# Patient Record
Sex: Female | Born: 1980 | Race: White | Hispanic: No | Marital: Single | Smoking: Current every day smoker
Health system: Southern US, Community
[De-identification: ages and names within clinical notes are randomized; demographics above are authoritative.]

## PROBLEM LIST (undated history)

## (undated) DIAGNOSIS — F909 Attention-deficit hyperactivity disorder, unspecified type: Secondary | ICD-10-CM

## (undated) DIAGNOSIS — E079 Disorder of thyroid, unspecified: Secondary | ICD-10-CM

---

## 2001-08-25 ENCOUNTER — Other Ambulatory Visit: Admission: RE | Admit: 2001-08-25 | Discharge: 2001-08-25 | Payer: Self-pay | Admitting: *Deleted

## 2002-01-12 ENCOUNTER — Ambulatory Visit (HOSPITAL_COMMUNITY): Admission: RE | Admit: 2002-01-12 | Discharge: 2002-01-12 | Payer: Self-pay | Admitting: Gastroenterology

## 2002-09-12 ENCOUNTER — Other Ambulatory Visit: Admission: RE | Admit: 2002-09-12 | Discharge: 2002-09-12 | Payer: Self-pay | Admitting: *Deleted

## 2003-11-15 ENCOUNTER — Other Ambulatory Visit: Admission: RE | Admit: 2003-11-15 | Discharge: 2003-11-15 | Payer: Self-pay | Admitting: Family Medicine

## 2008-08-03 ENCOUNTER — Encounter: Admission: RE | Admit: 2008-08-03 | Discharge: 2008-08-03 | Payer: Self-pay | Admitting: Internal Medicine

## 2010-07-19 NOTE — Op Note (Signed)
   NAMEBREKYN, Kayla Robinson                       ACCOUNT NO.:  [id number]   MEDICAL RECORD NO.:  [id number]                   PATIENT TYPE:   LOCATION:                                       FACILITY:   PHYSICIAN:  Anselmo Rod, M.D.               DATE OF BIRTH:  27-Mar-1980   DATE OF PROCEDURE:  01/12/2002  DATE OF DISCHARGE:                                 OPERATIVE REPORT   PROCEDURE PERFORMED:  Colonoscopy.   ENDOSCOPIST:  Charna Elizabeth, M.D.   INSTRUMENT USED:  Pediatric adjustable Olympus colonoscope.   INDICATIONS FOR PROCEDURE:  The patient is a 30 year old white female with a  history of guaiac positive stools.  Family history of colon cancer in a  maternal aunt and ovarian cancer in her mother.  Rule out colonic polyps,  masses,  etc.  versus IBD.   PREPROCEDURE PREPARATION:  Informed consent was procured from the patient.  The patient was fasted for eight hours prior to the procedure and prepped  with a bottle of magnesium citrate and a gallon of NuLytely the night prior  to the procedure.   PREPROCEDURE PHYSICAL:  The patient had stable vital signs.  Neck supple.  Chest clear to auscultation.  S1 and S2 regular.  Abdomen soft with normal  bowel sounds.   DESCRIPTION OF PROCEDURE:  The patient was placed in left lateral decubitus  position and sedated with 100 mg of Demerol and 14 mg of Versed  intravenously.  Once the patient was adequately sedated and maintained on  low flow oxygen and continuous cardiac monitoring, the Olympus video  colonoscope was advanced from the rectum to the cecum and terminal ileum  without difficulty.  The entire colonic mucosa appeared healthy with a  normal vascular pattern.  No masses, polyps, erosions, ulcerations or  diverticula were seen.   IMPRESSION:  Normal colonoscopy to the terminal ileum.   RECOMMENDATIONS:  1. Repeat guaiac testing on an outpatient basis.  Further recommendations     made thereafter.  2.     Considering  the patient's family history of colon cancer, repeat colorectal      cancer screening is recommended at the age of 16 unless the patient     develops any abnormal symptoms in the interim.  3. High fiber diet with liberal fluid intake.                                                   Anselmo Rod, M.D.    JNM/MEDQ  D:  01/12/2002  T:  01/12/2002  Job:  [id number]   cc:   Talmadge Coventry, M.D.

## 2010-12-30 ENCOUNTER — Ambulatory Visit: Payer: Self-pay

## 2010-12-30 ENCOUNTER — Other Ambulatory Visit: Payer: Self-pay | Admitting: Occupational Medicine

## 2010-12-30 DIAGNOSIS — M542 Cervicalgia: Secondary | ICD-10-CM

## 2011-01-21 ENCOUNTER — Ambulatory Visit: Payer: Self-pay

## 2011-01-31 ENCOUNTER — Ambulatory Visit
Payer: Worker's Compensation | Attending: Occupational Medicine | Admitting: Rehabilitative and Restorative Service Providers"

## 2011-01-31 DIAGNOSIS — M545 Low back pain, unspecified: Secondary | ICD-10-CM | POA: Insufficient documentation

## 2011-01-31 DIAGNOSIS — M546 Pain in thoracic spine: Secondary | ICD-10-CM | POA: Insufficient documentation

## 2011-01-31 DIAGNOSIS — M542 Cervicalgia: Secondary | ICD-10-CM | POA: Insufficient documentation

## 2011-01-31 DIAGNOSIS — IMO0001 Reserved for inherently not codable concepts without codable children: Secondary | ICD-10-CM | POA: Insufficient documentation

## 2011-01-31 DIAGNOSIS — M2560 Stiffness of unspecified joint, not elsewhere classified: Secondary | ICD-10-CM | POA: Insufficient documentation

## 2011-02-04 ENCOUNTER — Ambulatory Visit
Payer: Worker's Compensation | Attending: Occupational Medicine | Admitting: Rehabilitative and Restorative Service Providers"

## 2011-02-04 DIAGNOSIS — M542 Cervicalgia: Secondary | ICD-10-CM | POA: Insufficient documentation

## 2011-02-04 DIAGNOSIS — IMO0001 Reserved for inherently not codable concepts without codable children: Secondary | ICD-10-CM | POA: Insufficient documentation

## 2011-02-04 DIAGNOSIS — M2560 Stiffness of unspecified joint, not elsewhere classified: Secondary | ICD-10-CM | POA: Insufficient documentation

## 2011-02-04 DIAGNOSIS — M545 Low back pain, unspecified: Secondary | ICD-10-CM | POA: Insufficient documentation

## 2011-02-04 DIAGNOSIS — M546 Pain in thoracic spine: Secondary | ICD-10-CM | POA: Insufficient documentation

## 2011-02-05 ENCOUNTER — Ambulatory Visit: Payer: Worker's Compensation | Admitting: Rehabilitative and Restorative Service Providers"

## 2011-02-12 ENCOUNTER — Ambulatory Visit: Payer: Worker's Compensation | Admitting: Rehabilitative and Restorative Service Providers"

## 2011-02-13 ENCOUNTER — Ambulatory Visit: Payer: Worker's Compensation | Admitting: Rehabilitative and Restorative Service Providers"

## 2011-02-18 ENCOUNTER — Ambulatory Visit: Payer: Worker's Compensation | Admitting: Rehabilitative and Restorative Service Providers"

## 2011-02-19 ENCOUNTER — Ambulatory Visit: Payer: Worker's Compensation | Admitting: Rehabilitative and Restorative Service Providers"

## 2013-03-26 IMAGING — CR DG CERVICAL SPINE COMPLETE 4+V
5 series · 5 of 5 positions shown · non-contrast
Comparison: Plain film cervical spine 08/03/2008.

CLINICAL DATA: Neck pain.

CERVICAL SPINE - COMPLETE 4+ VIEW

[view not recorded (1 of 5)]
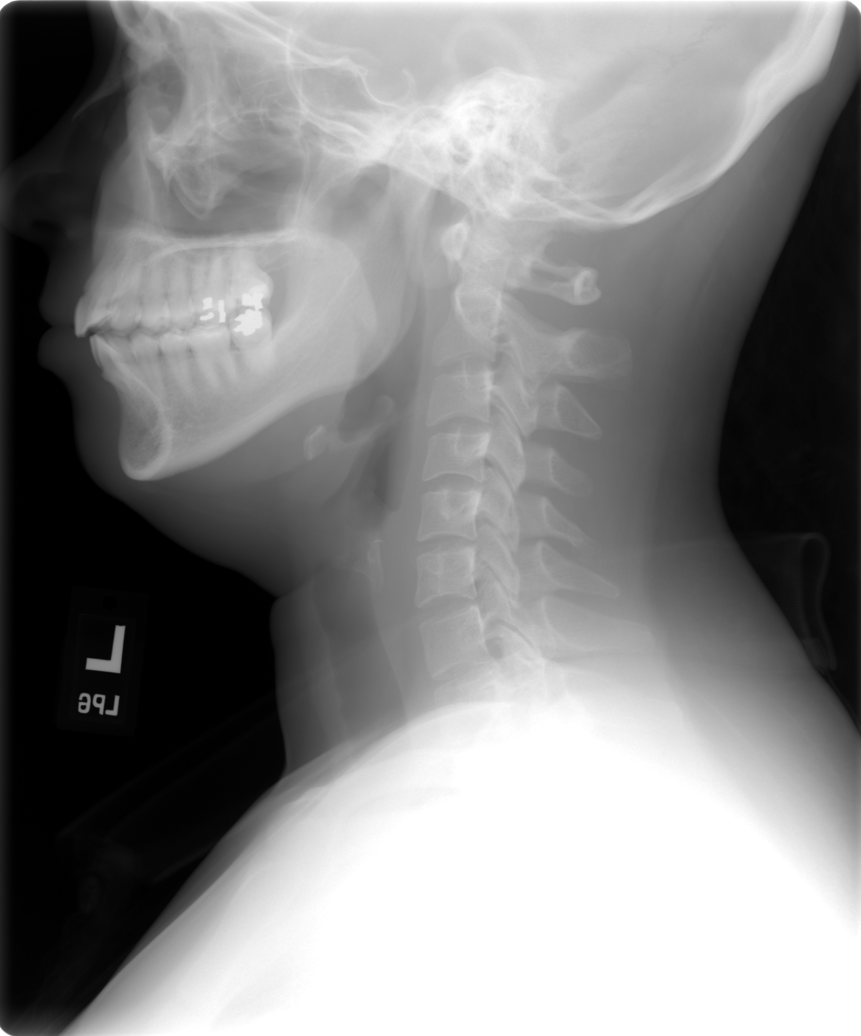

[view not recorded (2 of 5)]
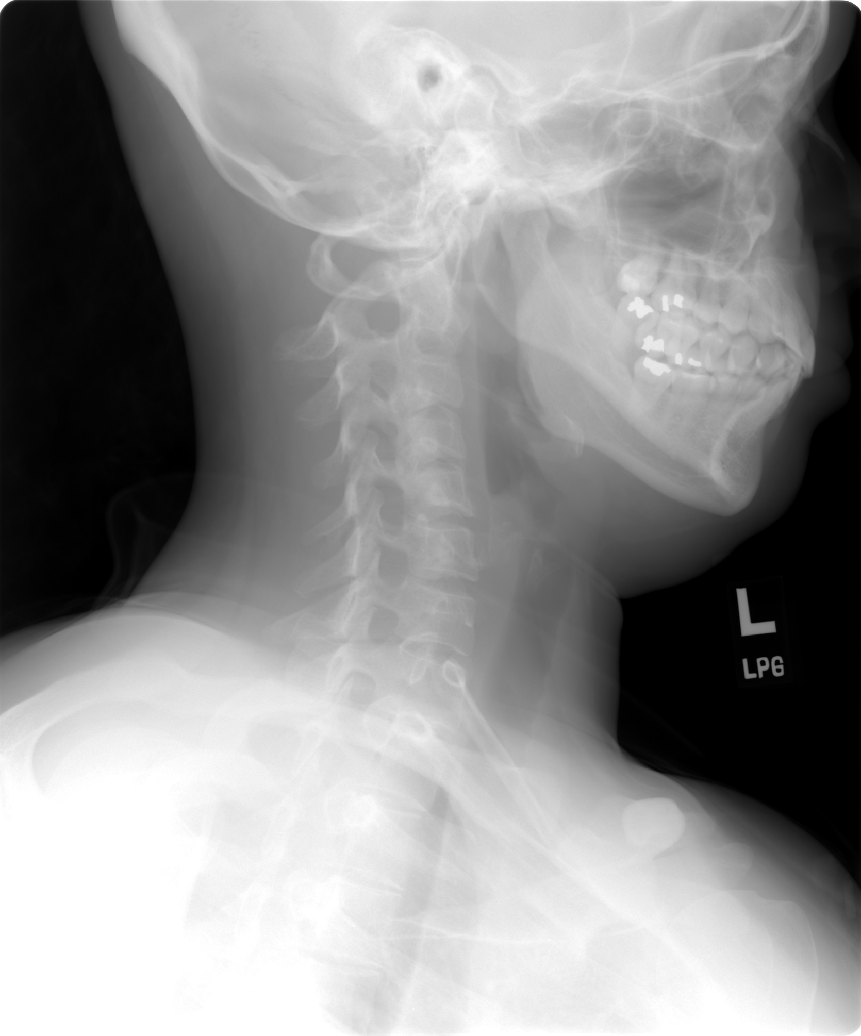

[view not recorded (3 of 5)]
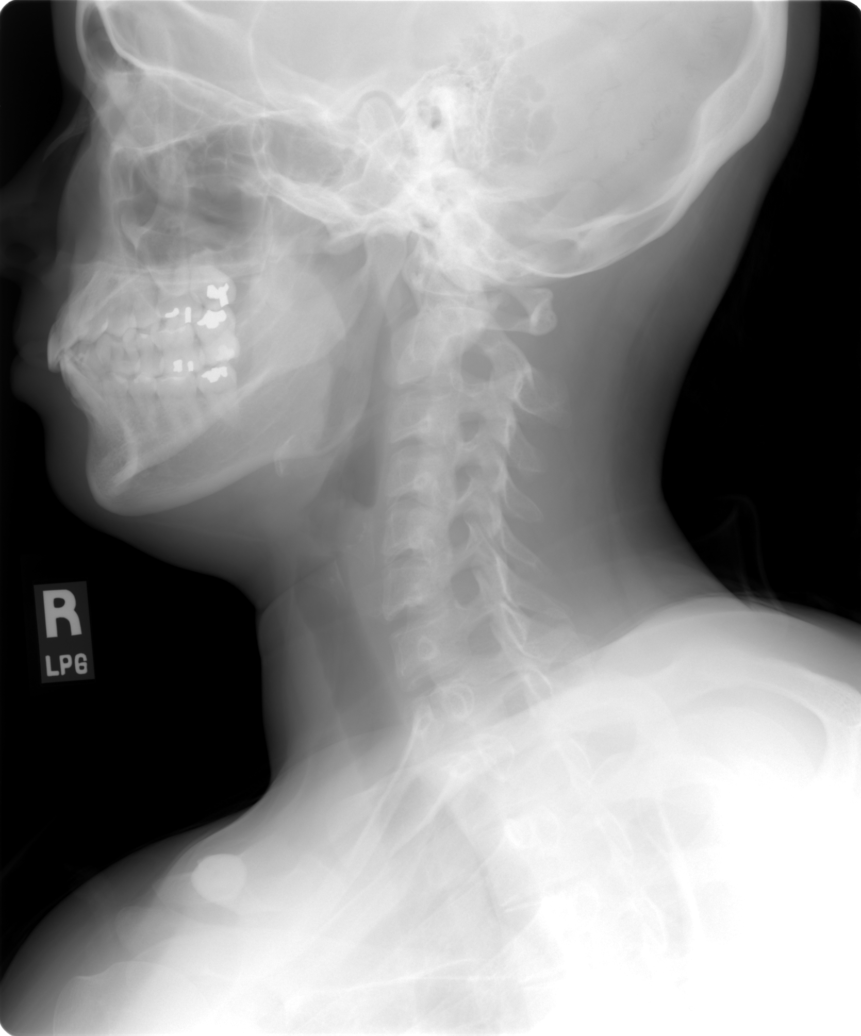

[view not recorded (4 of 5)]
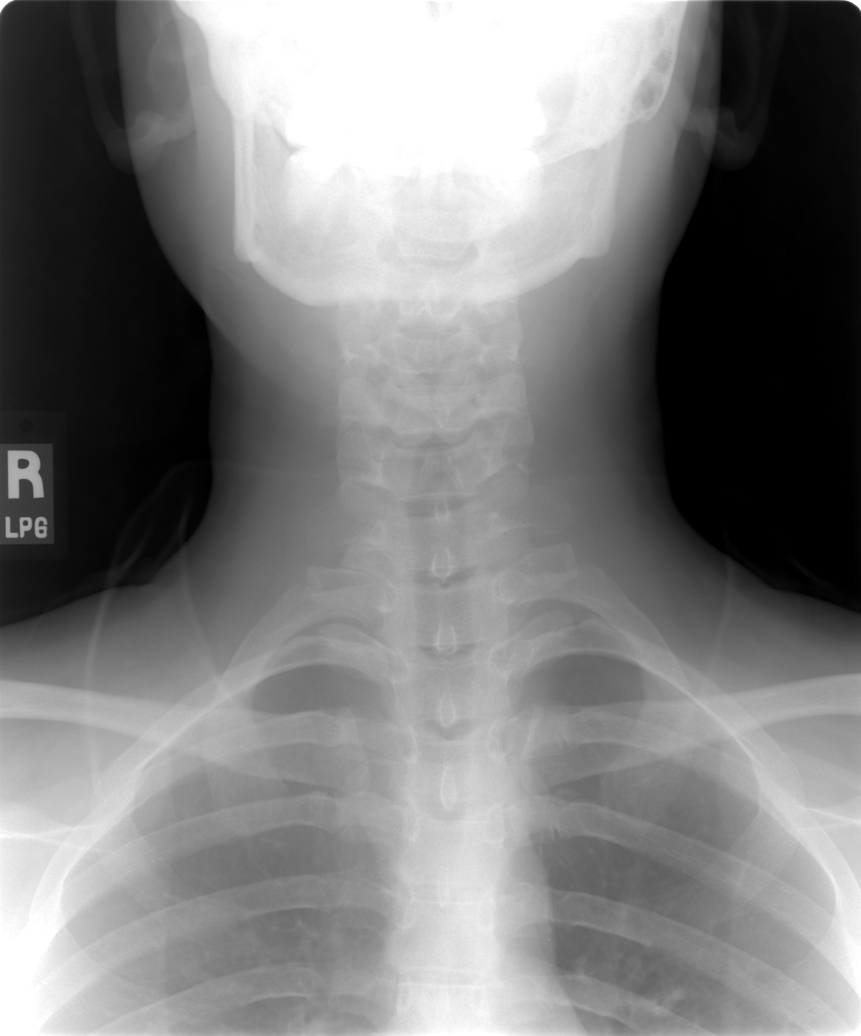

[view not recorded (5 of 5)]
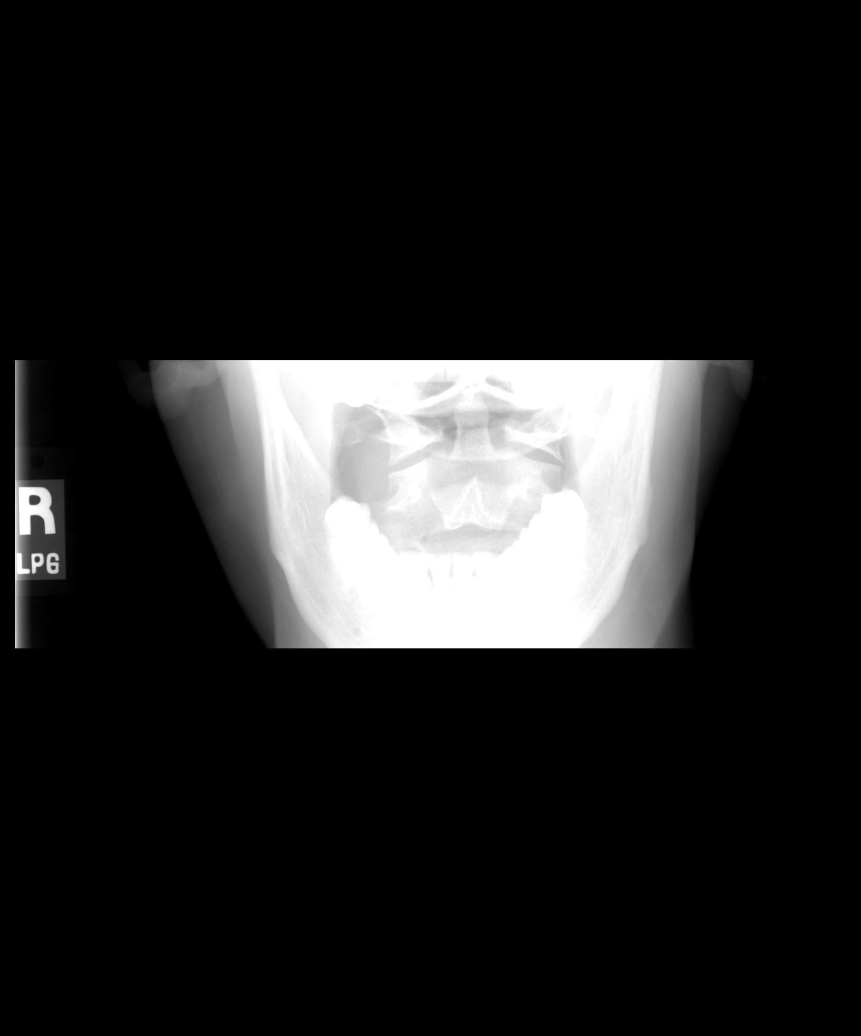

[5 of 5 positions shown; findings below may reference images not displayed]

FINDINGS: Vertebral body height and alignment are normal.
Intervertebral disc space height is maintained.  Neural foramina
widely patent at all levels.  Lung apices are clear.  Prevertebral
soft tissues appear normal.
IMPRESSION: Negative exam.

## 2015-05-09 ENCOUNTER — Emergency Department (HOSPITAL_COMMUNITY)
Admission: EM | Admit: 2015-05-09 | Discharge: 2015-05-09 | Disposition: A | Discharge status: Home / Self Care | Payer: Worker's Compensation | Attending: Emergency Medicine | Admitting: Emergency Medicine

## 2015-05-09 ENCOUNTER — Encounter (HOSPITAL_COMMUNITY): Payer: Self-pay

## 2015-05-09 DIAGNOSIS — Y990 Civilian activity done for income or pay: Secondary | ICD-10-CM | POA: Insufficient documentation

## 2015-05-09 DIAGNOSIS — S61011A Laceration without foreign body of right thumb without damage to nail, initial encounter: Secondary | ICD-10-CM

## 2015-05-09 DIAGNOSIS — W268XXA Contact with other sharp object(s), not elsewhere classified, initial encounter: Secondary | ICD-10-CM | POA: Insufficient documentation

## 2015-05-09 DIAGNOSIS — Y9389 Activity, other specified: Secondary | ICD-10-CM | POA: Insufficient documentation

## 2015-05-09 DIAGNOSIS — Y9289 Other specified places as the place of occurrence of the external cause: Secondary | ICD-10-CM | POA: Insufficient documentation

## 2015-05-09 DIAGNOSIS — F17200 Nicotine dependence, unspecified, uncomplicated: Secondary | ICD-10-CM | POA: Insufficient documentation

## 2015-05-09 DIAGNOSIS — Z23 Encounter for immunization: Secondary | ICD-10-CM | POA: Insufficient documentation

## 2015-05-09 DIAGNOSIS — Z8639 Personal history of other endocrine, nutritional and metabolic disease: Secondary | ICD-10-CM | POA: Insufficient documentation

## 2015-05-09 DIAGNOSIS — Z8659 Personal history of other mental and behavioral disorders: Secondary | ICD-10-CM | POA: Insufficient documentation

## 2015-05-09 HISTORY — DX: Attention-deficit hyperactivity disorder, unspecified type: F90.9

## 2015-05-09 HISTORY — DX: Disorder of thyroid, unspecified: E07.9

## 2015-05-09 MED ORDER — TETANUS-DIPHTH-ACELL PERTUSSIS 5-2.5-18.5 LF-MCG/0.5 IM SUSP
0.5000 mL | Freq: Once | INTRAMUSCULAR | Status: AC
  Administered 2015-05-09: 0.5 mL via INTRAMUSCULAR
  Filled 2015-05-09: qty 0.5

## 2015-05-09 NOTE — Discharge Instructions (Signed)
The laceration was superficial, and so we didn't need stitches.  Please read the instructions provided on wound care. Keep the area clean and dry, apply bacitracin ointment daily and take the medications provided. RETURN TO THE ER IF THERE IS INCREASED PAIN, REDNESS, PUS COMING OUT from the wound site.   Stitches, Staples, or Adhesive Wound Closure Health care providers use stitches (sutures), staples, and certain glue (skin adhesives) to hold skin together while it heals (wound closure). You may need this treatment after you have surgery or if you cut your skin accidentally. These methods help your skin to heal more quickly and make it less likely that you will have a scar. A wound may take several months to heal completely. The type of wound you have determines when your wound gets closed. In most cases, the wound is closed as soon as possible (primary skin closure). Sometimes, closure is delayed so the wound can be cleaned and allowed to heal naturally. This reduces the chance of infection. Delayed closure may be needed if your wound:  Is caused by a bite.  Happened more than 6 hours ago.  Involves loss of skin or the tissues under the skin.  Has dirt or debris in it that cannot be removed.  Is infected. WHAT ARE THE DIFFERENT KINDS OF WOUND CLOSURES? There are many options for wound closure. The one that your health care provider uses depends on how deep and how large your wound is. Adhesive Glue To use this type of glue to close a wound, your health care provider holds the edges of the wound together and paints the glue on the surface of your skin. You may need more than one layer of glue. Then the wound may be covered with a light bandage (dressing). This type of skin closure may be used for small wounds that are not deep (superficial). Using glue for wound closure is less painful than other methods. It does not require a medicine that numbs the area (local anesthetic). This method also  leaves nothing to be removed. Adhesive glue is often used for children and on facial wounds. Adhesive glue cannot be used for wounds that are deep, uneven, or bleeding. It is not used inside of a wound.  Adhesive Strips These strips are made of sticky (adhesive), porous paper. They are applied across your skin edges like a regular adhesive bandage. You leave them on until they fall off. Adhesive strips may be used to close very superficial wounds. They may also be used along with sutures to improve the closure of your skin edges.  Sutures Sutures are the oldest method of wound closure. Sutures can be made from natural substances, such as silk, or from synthetic materials, such as nylon and steel. They can be made from a material that your body can break down as your wound heals (absorbable), or they can be made from a material that needs to be removed from your skin (nonabsorbable). They come in many different strengths and sizes. Your health care provider attaches the sutures to a steel needle on one end. Sutures can be passed through your skin, or through the tissues beneath your skin. Then they are tied and cut. Your skin edges may be closed in one continuous stitch or in separate stitches. Sutures are strong and can be used for all kinds of wounds. Absorbable sutures may be used to close tissues under the skin. The disadvantage of sutures is that they may cause skin reactions that lead to infection.  Nonabsorbable sutures need to be removed. Staples When surgical staples are used to close a wound, the edges of your skin on both sides of the wound are brought close together. A staple is placed across the wound, and an instrument secures the edges together. Staples are often used to close surgical cuts (incisions). Staples are faster to use than sutures, and they cause less skin reaction. Staples need to be removed using a tool that bends the staples away from your skin. HOW DO I CARE FOR MY WOUND  CLOSURE?  Take medicines only as directed by your health care provider.  If you were prescribed an antibiotic medicine for your wound, finish it all even if you start to feel better.  Use ointments or creams only as directed by your health care provider.  Wash your hands with soap and water before and after touching your wound.  Do not soak your wound in water. Do not take baths, swim, or use a hot tub until your health care provider approves.  Ask your health care provider when you can start showering. Cover your wound if directed by your health care provider.  Do not take out your own sutures or staples.  Do not pick at your wound. Picking can cause an infection.  Keep all follow-up visits as directed by your health care provider. This is important. HOW LONG WILL I HAVE MY WOUND CLOSURE?  Leave adhesive glue on your skin until the glue peels away.  Leave adhesive strips on your skin until the strips fall off.  Absorbable sutures will dissolve within several days.  Nonabsorbable sutures and staples must be removed. The location of the wound will determine how long they stay in. This can range from several days to a couple of weeks. WHEN SHOULD I SEEK HELP FOR MY WOUND CLOSURE? Contact your health care provider if:  You have a fever.  You have chills.  You have drainage, redness, swelling, or pain at your wound.  There is a bad smell coming from your wound.  The skin edges of your wound start to separate after your sutures have been removed.  Your wound becomes thick, raised, and darker in color after your sutures come out (scarring).   This information is not intended to replace advice given to you by your health care provider. Make sure you discuss any questions you have with your health care provider.   Document Released: 11/12/2000 Document Revised: 03/10/2014 Document Reviewed: 07/27/2013 Elsevier Interactive Patient Education 2016 [employer]  Wound  Care Taking care of your wound properly can help to prevent pain and infection. It can also help your wound to heal more quickly.  HOW TO CARE FOR YOUR WOUND  Take or apply over-the-counter and prescription medicines only as told by your health care provider.  If you were prescribed antibiotic medicine, take or apply it as told by your health care provider. Do not stop using the antibiotic even if your condition improves.  Clean the wound each day or as told by your health care provider.  Wash the wound with mild soap and water.  Rinse the wound with water to remove all soap.  Pat the wound dry with a clean towel. Do not rub it.  There are many different ways to close and cover a wound. For example, a wound can be covered with stitches (sutures), skin glue, or adhesive strips. Follow instructions from your health care provider about:  How to take care of your wound.  When  and how you should change your bandage (dressing).  When you should remove your dressing.  Removing whatever was used to close your wound.  Check your wound every day for signs of infection. Watch for:  Redness, swelling, or pain.  Fluid, blood, or pus.  Keep the dressing dry until your health care provider says it can be removed. Do not take baths, swim, use a hot tub, or do anything that would put your wound underwater until your health care provider approves.  Raise (elevate) the injured area above the level of your heart while you are sitting or lying down.  Do not scratch or pick at the wound.  Keep all follow-up visits as told by your health care provider. This is important. SEEK MEDICAL CARE IF:  You received a tetanus shot and you have swelling, severe pain, redness, or bleeding at the injection site.  You have a fever.  Your pain is not controlled with medicine.  You have increased redness, swelling, or pain at the site of your wound.  You have fluid, blood, or pus coming from your  wound.  You notice a bad smell coming from your wound or your dressing. SEEK IMMEDIATE MEDICAL CARE IF:  You have a red streak going away from your wound.   This information is not intended to replace advice given to you by your health care provider. Make sure you discuss any questions you have with your health care provider.   Document Released: 11/27/2007 Document Revised: 07/04/2014 Document Reviewed: 02/13/2014 Elsevier Interactive Patient Education [employer]

## 2015-05-09 NOTE — ED Notes (Signed)
Pt cut rt thumb on new box cutter x 1 hour ago.  Pt has bandaid in place and bleeding controlled.  Concerned for tetanus out of date.

## 2015-05-09 NOTE — ED Notes (Signed)
Minor superficial laceration to right base of thumb, wound cleaned with chlorhexidine swab, glued together and steri stripped by MD without complication.

## 2015-05-09 NOTE — ED Provider Notes (Signed)
CSN: [id number]     Arrival date & time 05/09/15  0806 History   First MD Initiated Contact with Patient 05/09/15 [phone]     Chief Complaint  Patient presents with  . Laceration     (Consider location/radiation/quality/duration/timing/severity/associated sxs/prior Treatment) HPI Comments: Pt comes in with cc of laceration. Pt was at work and he accidentally cut her thumb with a box cutter. Though the knife was brand new, the boxes were dirty, so she was asked to come to the ER. The cut is on the R side. Pt is R handed. She has no numbness. Mild pain only. Tetanus is not UTD.   Patient is a 35 y.o. female presenting with skin laceration. The history is provided by the patient.  Laceration   Past Medical History  Diagnosis Date  . Thyroid disease   . ADHD (attention deficit hyperactivity disorder)    History reviewed. No pertinent past surgical history. History reviewed. No pertinent family history. Social History  Substance Use Topics  . Smoking status: Current Every Day Smoker  . Smokeless tobacco: None  . Alcohol Use: Yes     Comment: social   OB History    No data available     Review of Systems  Skin: Positive for wound.  Allergic/Immunologic: Negative for immunocompromised state.  Neurological: Negative for weakness and numbness.  Hematological: Does not bruise/bleed easily.      Allergies  Review of patient's allergies indicates no known allergies.  Home Medications   Prior to Admission medications   Not on File   BP 125/73 mmHg  Pulse 88  Temp(Src) 97.8 F (36.6 C) (Oral)  Resp 16  SpO2 100%  LMP 05/02/2015 Physical Exam  Constitutional: She is oriented to person, place, and time. She appears well-developed.  HENT:  Head: Normocephalic and atraumatic.  Eyes: EOM are normal.  Neck: Normal range of motion. Neck supple.  Cardiovascular: Normal rate.   Pulmonary/Chest: Effort normal.  Abdominal: Bowel sounds are normal.  Musculoskeletal:  Abduction,  opposition and flexion of the thumb is normal. ROM is intact completely.  Neurological: She is alert and oriented to person, place, and time.  Gross sensory exam of the thumb is normal and equal  Skin: Skin is warm and dry.  1 cm lineal laceration proximal to the IP joint on the R thumb dorsal aspect. No active bleeding. Laceration is clean and superficial.   Nursing note and vitals reviewed.   ED Course  Procedures (including critical care time) Labs Review Labs Reviewed - No data to display  Imaging Review No results found. I have personally reviewed and evaluated these images and lab results as part of my medical decision-making.   EKG Interpretation None      MDM   Final diagnoses:  Laceration of thumb, right, initial encounter    LACERATION REPAIR Performed by: Derwood KaplanNanavati, [place] Basham Authorized by: Derwood KaplanNanavati, Kaevon Cotta Consent: Verbal consent obtained. Risks and benefits: risks, benefits and alternatives were discussed Consent given by: patient Patient identity confirmed: provided demographic data Prepped and Draped in normal sterile fashion Wound explored  Laceration Location: Right thumb  Laceration Length: 1 cm  No Foreign Bodies seen or palpated  Anesthesia: local: none  Local anesthetic: NONE  Irrigation method: direct irrigation under running water for 1 minute Amount of cleaning: standard  Skin closure: primary  Number of sutures: 0  Technique: dermabond and steristrips  Patient tolerance: Patient tolerated the procedure well with no immediate complications.   Pt comes in with cc  of finger lac. Tetanus updated. Superficial lac only. Cleaned under direct pressure from running tap water, cleansed with betadine thereafter. Strict ER return precaution discussed for infection.  Derwood Kaplan, MD 05/09/15 [phone]

## 2016-03-17 DIAGNOSIS — Z23 Encounter for immunization: Secondary | ICD-10-CM | POA: Diagnosis not present

## 2016-03-17 DIAGNOSIS — E039 Hypothyroidism, unspecified: Secondary | ICD-10-CM | POA: Diagnosis not present

## 2016-09-04 DIAGNOSIS — J0100 Acute maxillary sinusitis, unspecified: Secondary | ICD-10-CM | POA: Diagnosis not present

## 2016-09-15 DIAGNOSIS — E039 Hypothyroidism, unspecified: Secondary | ICD-10-CM | POA: Diagnosis not present

## 2016-09-15 DIAGNOSIS — E559 Vitamin D deficiency, unspecified: Secondary | ICD-10-CM | POA: Diagnosis not present

## 2016-11-19 DIAGNOSIS — Z01419 Encounter for gynecological examination (general) (routine) without abnormal findings: Secondary | ICD-10-CM | POA: Diagnosis not present

## 2016-11-19 DIAGNOSIS — Z124 Encounter for screening for malignant neoplasm of cervix: Secondary | ICD-10-CM | POA: Diagnosis not present

## 2016-12-28 DIAGNOSIS — J069 Acute upper respiratory infection, unspecified: Secondary | ICD-10-CM | POA: Diagnosis not present

## 2016-12-28 DIAGNOSIS — J029 Acute pharyngitis, unspecified: Secondary | ICD-10-CM | POA: Diagnosis not present

## 2017-06-11 DIAGNOSIS — E039 Hypothyroidism, unspecified: Secondary | ICD-10-CM | POA: Diagnosis not present

## 2017-08-11 DIAGNOSIS — E039 Hypothyroidism, unspecified: Secondary | ICD-10-CM | POA: Diagnosis not present

## 2017-12-10 DIAGNOSIS — E039 Hypothyroidism, unspecified: Secondary | ICD-10-CM | POA: Diagnosis not present

## 2017-12-31 DIAGNOSIS — Z01419 Encounter for gynecological examination (general) (routine) without abnormal findings: Secondary | ICD-10-CM | POA: Diagnosis not present

## 2017-12-31 DIAGNOSIS — Z8041 Family history of malignant neoplasm of ovary: Secondary | ICD-10-CM | POA: Diagnosis not present

## 2017-12-31 DIAGNOSIS — Z124 Encounter for screening for malignant neoplasm of cervix: Secondary | ICD-10-CM | POA: Diagnosis not present

## 2017-12-31 DIAGNOSIS — Z803 Family history of malignant neoplasm of breast: Secondary | ICD-10-CM | POA: Diagnosis not present

## 2017-12-31 DIAGNOSIS — Z800 Family history of malignant neoplasm of digestive organs: Secondary | ICD-10-CM | POA: Diagnosis not present

## 2020-08-23 ENCOUNTER — Other Ambulatory Visit (HOSPITAL_BASED_OUTPATIENT_CLINIC_OR_DEPARTMENT_OTHER): Payer: Self-pay

## 2020-08-23 DIAGNOSIS — R443 Hallucinations, unspecified: Secondary | ICD-10-CM

## 2020-08-23 DIAGNOSIS — G478 Other sleep disorders: Secondary | ICD-10-CM

## 2020-08-23 DIAGNOSIS — G4719 Other hypersomnia: Secondary | ICD-10-CM
